# Patient Record
Sex: Female | Born: 1970 | Race: White | Hispanic: No | Marital: Married
Health system: Midwestern US, Academic
[De-identification: ages and names within clinical notes are randomized; demographics above are authoritative.]

---

## 2020-01-20 ENCOUNTER — Ambulatory Visit: Admit: 2020-01-20 | Discharge: 2020-01-21 | Payer: Private Health Insurance - Indemnity

## 2020-01-20 ENCOUNTER — Encounter: Admit: 2020-01-20 | Discharge: 2020-01-20 | Payer: Private Health Insurance - Indemnity

## 2020-01-20 DIAGNOSIS — D229 Melanocytic nevi, unspecified: Secondary | ICD-10-CM

## 2020-01-20 DIAGNOSIS — L700 Acne vulgaris: Secondary | ICD-10-CM

## 2020-01-20 DIAGNOSIS — L814 Other melanin hyperpigmentation: Secondary | ICD-10-CM

## 2020-01-20 MED ORDER — SPIRONOLACTONE 25 MG PO TAB
75 mg | ORAL_TABLET | Freq: Every day | ORAL | 3 refills | 90.00000 days | Status: AC
Start: 2020-01-20 — End: ?

## 2020-01-20 MED ORDER — TRETINOIN 0.1 % TP CREA
Freq: Every evening | TOPICAL | 3 refills | Status: AC
Start: 2020-01-20 — End: ?

## 2020-01-20 NOTE — Progress Notes
ATTESTATION    I personally performed the key portions of the E/M visit, discussed case with resident and concur with resident documentation of history, physical exam, assessment, and treatment plan unless otherwise noted.     Staff name:  Amaia Lavallie A Mersades Barbaro, MD Date:  01/20/2020

## 2020-01-20 NOTE — Progress Notes
Date of Service: 01/20/2020    Subjective:             Shelley Aguilar is a 49 y.o. female.    History of Present Illness  NEW PATIENT referred by Maisie Fus  No personal or family h/o skin cancer. No h/o blistering sunburns.    # Acne  - Pt reports that she has had significant issues with cystic acne x 1 year  - pt develops deep, painful lesions on the chin/jawline  - She notes that development of cystic lesions is cyclical, as if they are developing around her menstrual cycle, although she has been amenorrheic for 18 months now  - She is developing scarring on the chin d/t these lesions  - Prior treatments include topical tretinoin, PO doxycycline. These did not help her acne       Review of Systems   Constitutional: Negative for appetite change, diaphoresis, fatigue, fever and unexpected weight change.   HENT: Negative for congestion, mouth sores and sore throat.    Eyes: Negative for pain, redness, itching and visual disturbance.   Respiratory: Negative for cough and shortness of breath.    Cardiovascular: Negative for palpitations and leg swelling.   Gastrointestinal: Negative for abdominal pain, blood in stool, diarrhea, nausea and vomiting.   Genitourinary: Negative for difficulty urinating and hematuria.   Musculoskeletal: Negative for arthralgias and myalgias.   Neurological: Negative for dizziness, seizures and facial asymmetry.   Hematological: Does not bruise/bleed easily.   Psychiatric/Behavioral: Negative for confusion and dysphoric mood. The patient is not nervous/anxious.          Objective:         No current outpatient medications on file.     Vitals:    01/20/20 1048   Weight: 77.1 kg (170 lb)   Height: 175.3 cm (69)   PainSc: Zero     Body mass index is 25.1 kg/m?Marland Kitchen     Physical Exam  Areas Examined (all normal unless noted below):  Head/Face  Neck  Chest/breasts/axillae  Back  Abdomen  Buttocks/groin  R upper ext  L upper ext  R lower ext  L lower ext    Pertinent findings include:  Multiple brown and tan evenly pigmented macules are distributed over the head, neck, trunk, arms and legs.  All have symmetric similar dermascopic findings with primarily globular and reticular patterns.    Reticulated hyperpigmented macules distributed in sun-exposed areas of face, arms    Erythematous papules and pustules on the cheeks, jawline  Few pitted scars on the chin       Assessment and Plan:  # Acne- hormonal  - Discussed diagnosis, etiology  - Counseled on treatment options including topicals, systemic therapies. Pt is not interested in repeating treatment w/ topicals or PO Abx d/t lack of improvement in the past  - Start OTC BPO wash QAM  - Resume tretinoin 0.1% cream QHS as tolerated  - Start Rx spironolactone 75 mg PO daily. Advised that pt may start with 25-50 mg initially, then increase as tolerated.  - Disused benefits/risks and common side effects including lightheadedness, dizziness, increased urination, breast tenderness, menstrual irregularities, and headaches. If experiencing severe muscle cramps, instructed to stop medication and see medical attention  - Pt denies any kidney or heart problems, not on any BP meds  - Discussed that pt needs to hold spironolactone if taking any sulfa drug  - Previously discussed black box warning of increased risk of breast cancer although there is now  data showing no increased risks of breast cancer in pts with family hx of breast cancer    # Melanocytic nevi  - Will cont to monitor  - RTC for new/changing lesions  - counseled on ABCD's of melanoma   - counseled on sunscreen SPF 30 or greater and wide-brimmed hat use  - recommend vitamin D3 1000-2000 U/day in winter    # Solar lentigo  - counseled on benign nature  - recommended sun avoidance/sun protection  - Monitor for changes      RTC 4 months

## 2020-01-20 NOTE — Patient Instructions
Differin brand 5% benzoyl peroxide wash

## 2020-02-23 ENCOUNTER — Encounter: Admit: 2020-02-23 | Discharge: 2020-02-23 | Payer: Private Health Insurance - Indemnity

## 2020-03-29 ENCOUNTER — Encounter: Admit: 2020-03-29 | Discharge: 2020-03-29 | Payer: Private Health Insurance - Indemnity

## 2020-05-13 ENCOUNTER — Encounter: Admit: 2020-05-13 | Discharge: 2020-05-13 | Payer: Private Health Insurance - Indemnity

## 2020-06-09 ENCOUNTER — Encounter: Admit: 2020-06-09 | Discharge: 2020-06-09 | Payer: Private Health Insurance - Indemnity

## 2020-06-13 ENCOUNTER — Encounter: Admit: 2020-06-13 | Discharge: 2020-06-13 | Payer: Private Health Insurance - Indemnity

## 2020-06-13 MED ORDER — SULFAMETHOXAZOLE-TRIMETHOPRIM 800-160 MG PO TAB
1 | ORAL_TABLET | Freq: Two times a day (BID) | ORAL | 0 refills | Status: AC
Start: 2020-06-13 — End: ?

## 2020-06-14 ENCOUNTER — Encounter: Admit: 2020-06-14 | Discharge: 2020-06-14 | Payer: Private Health Insurance - Indemnity

## 2020-06-14 DIAGNOSIS — L700 Acne vulgaris: Secondary | ICD-10-CM

## 2020-06-14 MED ORDER — DOXYCYCLINE HYCLATE 100 MG PO CAP
100 mg | ORAL_CAPSULE | Freq: Every day | ORAL | 4 refills | 8.00000 days | Status: AC
Start: 2020-06-14 — End: ?

## 2020-06-14 MED ORDER — SPIRONOLACTONE 50 MG PO TAB
150 mg | ORAL_TABLET | Freq: Every day | ORAL | 3 refills | 46.00000 days | Status: AC
Start: 2020-06-14 — End: ?

## 2020-09-19 ENCOUNTER — Encounter: Admit: 2020-09-19 | Discharge: 2020-09-19 | Payer: Private Health Insurance - Indemnity

## 2020-09-19 DIAGNOSIS — L700 Acne vulgaris: Secondary | ICD-10-CM

## 2020-09-19 MED ORDER — SPIRONOLACTONE 50 MG PO TAB
ORAL_TABLET | Freq: Every day | ORAL | 0 refills | 46.00000 days | Status: AC
Start: 2020-09-19 — End: ?

## 2020-09-19 NOTE — Telephone Encounter
Received E-Rx request from patients pharmacy for spironolactone, this is not a standing order, per protocol, routing to physician for approval.    LOV: 01/20/2020   NOV:   Future Appointments   Date Time Provider [place]   10/05/2020 10:00 AM Rowe Pavy, MD [facility]           Francisca December, RN

## 2020-09-28 ENCOUNTER — Encounter: Admit: 2020-09-28 | Discharge: 2020-09-28 | Payer: Private Health Insurance - Indemnity

## 2020-09-28 DIAGNOSIS — L700 Acne vulgaris: Secondary | ICD-10-CM

## 2020-10-05 ENCOUNTER — Ambulatory Visit: Admit: 2020-10-05 | Discharge: 2020-10-05 | Payer: Private Health Insurance - Indemnity

## 2020-10-05 ENCOUNTER — Encounter: Admit: 2020-10-05 | Discharge: 2020-10-05 | Payer: Private Health Insurance - Indemnity

## 2020-10-05 DIAGNOSIS — L700 Acne vulgaris: Secondary | ICD-10-CM

## 2020-10-05 DIAGNOSIS — D229 Melanocytic nevi, unspecified: Secondary | ICD-10-CM

## 2020-10-05 DIAGNOSIS — Z5181 Encounter for therapeutic drug level monitoring: Secondary | ICD-10-CM

## 2020-10-05 LAB — ESTRADIOL (E2): ESTRADIOL: <15 pg/mL

## 2020-10-05 LAB — FOLLICLE STIMULATING HORMONE: FSH: 106 mU/mL

## 2020-10-05 LAB — PROGESTERONE: PROGESTERONE: 0.8 ng/mL

## 2020-10-05 NOTE — Progress Notes
ATTESTATION    Tried to prescribe isotretinoin for her recalcitrant acne. She has gone through menopause but does not have the appropriate labs necessary for ipledge to dedicate her as not having child bearing potential. Will get necessary labs and see her back in 1 month for isotretinoin start    I personally performed the key portions of the E/M visit, discussed case with resident and concur with resident documentation of history, physical exam, assessment, and treatment plan unless otherwise noted.    Staff name:  Georgiann Hahn Date:  10/05/2020

## 2020-10-05 NOTE — Progress Notes
Date of Service: 10/05/2020    Subjective:             Shelley Aguilar is a 50 y.o. female.    History of Present Illness  [facility]  01/20/20 w/ Dr. Arlana Pouch     No personal or family h/o skin cancer. No h/o blistering sunburns.  # Acne  ?Prior Hx   - Pt reports that she has had significant issues with cystic acne x 1 year  - pt develops deep, painful lesions on the chin/jawline  - She notes that development of cystic lesions is cyclical, as if they are developing around her menstrual cycle, although she has been amenorrheic for 18 months now  - She is developing scarring on the chin d/t these lesions  - Prior treatments include topical tretinoin, PO doxycycline. These did not help her acne  Prior Tx   - Bactrim  Current Tx  - Doxycycline 100mg  daily   - Tretinoin topical cream 0.1% 2-3x weekly   - Spironolactone 150mg  daily   Interval Hx  - Patient states that she has improved since her last appt ~10 months ago, but is still miserable  - She states that her face still flares monthly, and when she does she gets painful swollen lymph nodes under her jaw  - She would like additional therapies today as she feels like nothing else helps consistently      Review of Systems   Constitutional: Negative for appetite change and unexpected weight change.   Gastrointestinal: Negative for diarrhea, nausea and vomiting.         Objective:         ? doxycycline hyclate (VIBRAMYCIN) 100 mg capsule Take one capsule by mouth daily.   ? spironolactone (ALDACTONE) 50 mg tablet TAKE 3 TABLETS BY MOUTH ONCE DAILY WITH FOOD   ? tretinoin (RETIN-A) 0.1 % topical cream Apply  topically to affected area at bedtime daily.     There were no vitals filed for this visit.  There is no height or weight on file to calculate BMI.     Physical Exam  Areas Examined (all normal unless noted below):  Head/Face  Neck  Chest/breasts/axillae  Back  Abdomen  Buttocks/groin  R upper ext  L upper ext  R lower ext  L lower ext  ?  Pertinent findings include:  Multiple brown and tan evenly pigmented macules are distributed over the head, neck,?trunk, arms and legs.? All have symmetric similar dermascopic findings with primarily globular and reticular patterns.  ?  Reticulated hyperpigmented macules distributed in sun-exposed areas of face, arms  ?  Erythematous papules and pustules on the cheeks, jawline  Few pitted scars on the chin       Assessment and Plan:  # Acne- hormonal, flaring  - Discussed diagnosis, etiology  - Counseled on treatment options including topicals, systemic therapies.  - Plan to start isotretinoin pending lab results regarding potential child bearing status  - Ordered estradiol, progesterone, FSH labs today   - Once isotretinoin initiated, counseled patient that we will discontinue other Rx treatments below   - Continue OTC BPO wash QAM  - Continue tretinoin 0.1% cream QHS as tolerated  - Continue Rx spironolactone 150 mg PO daily  - Continue Rx Doxycycline 100mg  PO daily   - Disused benefits/risks and common side effects including lightheadedness, dizziness, increased urination, breast tenderness, menstrual irregularities, and headaches. If experiencing severe muscle cramps, instructed to stop medication and see medical attention  - Pt denies  any kidney or heart problems, not on any BP meds  - Discussed that pt needs to hold spironolactone if taking any sulfa drug  - Previously discussed black box warning of increased risk of breast cancer although there is now data showing no increased risks of breast cancer in pts with family hx of breast cancer  ?  # Melanocytic nevi  - Will cont to monitor  - RTC for new/changing lesions  - counseled on ABCD's of melanoma   - counseled on sunscreen SPF 30 or greater and wide-brimmed hat use  - recommend vitamin D3 1000-2000 U/day in winter  ?  # Solar lentigo  - counseled on benign nature  - recommended sun avoidance/sun protection  - Monitor for changes  ?  ?  RTC 3 weeks, will start accutane pending lab results regarding child bearing status

## 2020-10-06 ENCOUNTER — Encounter: Admit: 2020-10-06 | Discharge: 2020-10-06 | Payer: Private Health Insurance - Indemnity

## 2020-10-26 ENCOUNTER — Encounter: Admit: 2020-10-26 | Discharge: 2020-10-26 | Payer: Private Health Insurance - Indemnity

## 2020-10-26 ENCOUNTER — Ambulatory Visit: Admit: 2020-10-26 | Discharge: 2020-10-26 | Payer: Private Health Insurance - Indemnity

## 2020-10-26 DIAGNOSIS — Z7689 Persons encountering health services in other specified circumstances: Secondary | ICD-10-CM

## 2020-10-26 DIAGNOSIS — L700 Acne vulgaris: Secondary | ICD-10-CM

## 2020-10-26 DIAGNOSIS — L730 Acne keloid: Secondary | ICD-10-CM

## 2020-10-26 MED ORDER — ISOTRETINOIN 30 MG PO CAP
30 mg | ORAL_CAPSULE | Freq: Two times a day (BID) | ORAL | 0 refills | Status: DC
Start: 2020-10-26 — End: 2020-10-26

## 2020-10-26 MED ORDER — ISOTRETINOIN 30 MG PO CAP
30 mg | ORAL_CAPSULE | Freq: Every day | ORAL | 0 refills | Status: AC
Start: 2020-10-26 — End: ?

## 2020-10-26 NOTE — Progress Notes
ATTESTATION    I personally performed the key portions of the E/M visit, discussed case with resident and concur with resident documentation of history, physical exam, assessment, and treatment plan unless otherwise noted.    Staff name:  Loriel Diehl Date:  10/26/2020

## 2020-10-26 NOTE — Progress Notes
Date of Service: 10/26/2020    Subjective:             Shelley Aguilar is a 50 y.o. female.    History of Present Illness    No personal or family h/o skin cancer. No h/o blistering sunburns.  #?Acne  ?Prior Hx   - Pt reports that she has had significant issues with cystic acne x 1 year  - pt develops deep, painful lesions on the chin/jawline  - She notes that development of cystic lesions is cyclical, as if they are developing around her menstrual cycle, although she has been amenorrheic for 18 months now  - She is developing scarring on the chin d/t these lesions  - Prior treatments include topical tretinoin, PO doxycycline. These did not help her acne  Prior Tx   - Bactrim  Current Tx  - Doxycycline 100mg  daily   - Tretinoin topical cream 0.1% 2-3x weekly   - Spironolactone 150mg  daily   Interval Hx  - Plan to start Accutane this visit as her labs are consistent with post-menopausal labs.   - using the doxycycline and spironolactone at the moment, but is still flaring        Review of Systems   Constitutional: Negative for appetite change and unexpected weight change.   Gastrointestinal: Negative for diarrhea, nausea and vomiting.         Objective:         ? doxycycline hyclate (VIBRAMYCIN) 100 mg capsule Take one capsule by mouth daily.   ? spironolactone (ALDACTONE) 50 mg tablet TAKE 3 TABLETS BY MOUTH ONCE DAILY WITH FOOD     Vitals:    10/26/20 1254   Resp: 16   Weight: 77.1 kg (170 lb)   Height: 172.7 cm (5' 8)     Body mass index is 25.85 kg/m?Marland Kitchen     Physical Exam    Areas Examined (all normal unless noted below):  Head/Face  Neck  Chest/breasts/axillae    Pertinent findings include:  General: Alert and Oriented x 3, Well-nourished  Eyes: Normal Conjunctivae, EOMI  Psych: normal mood    Multiple brown and tan evenly pigmented macules are distributed over the head, neck, trunk, arms and legs.  All have symmetric similar dermascopic findings with primarily globular and reticular patterns.       Multiple erythematous, inflammatory smooth 1-4 mm papules and open comedones are noted on the face.           Assessment and Plan:  # Acne- hormonal, flaring  1. Severe inflammatory Acne with scarring  - Discussed treatment options including topicals, oral antibiotics, and accutane  - Discussed A/R/B's of accutane in detail  - Patient recently had Lipids and LFTs which she will fax over   - Encouraged daily sunprotection and non-comedogenic moisturizer   - 2 forms of birth control: Patient is Post-menopausal   - Start (Rx) Isotretinoin 30 mg once daily   - Registered for iPledge today. iPledge number:  [id number]    Accutane is discussed fully with the patient. It is a very effective drug to treat acne vulgaris but has many significant side effects. Chief among these are teratogenesis, hepatic injury, dyslipidemia and severe drying of the mucous membranes, dry eyes, dry mouth, depression, unmasking crohn's/UC, SI, myalgias, bone pain, birth defects if she were to become pregnant, LFT elevation, WBC abnormalities, hypertriglyceridemia. All of these issues have been discussed in detail. Monthly blood tests to monitor lipids and liver functions will be necessary. Expect  painful dryness and/or fissuring around the lips, eyes, and other moist areas of the body. Balms may be protective. Temporarily, contact lens may be too painful to wear while on this drug. Episodes of significant depression have been reported, including suicidal ideation and attempts in rare cases. It may also cause pseudotumor cerebri and hyperostosis. The patient will report any such changes in mood, depressive symptoms or suicidal thoughts, headaches, joint or bone pains.    Female patients MUST use two simultaneous methods of family planning. Accutane is Category X for pregnancy, meaning it will cause fetal teratogenic malformations, and pregnancy MUST be avoided while on this drug.    The dose is 0.5-1 mg/kg in two divided doses for 15-20 weeks.      After discussion of these important issues, she and her mother indicate complete understanding of all of the above, and do wish to proceed with Accutane therapy.    - Patient was reminded to discontinue medication and call immediately for signs of depression or suicidal thought, use daily photo protection and oil-free moisturizer, avoid unprotected sex, alcohol ingestion, blood donation, elective procedures (tattoos, piercings, etc.) and was warned about possibility of arrested growth. Patient was reminded to continue two forms of birth control without interruption (or choose abstinence) and to fill prescription within 7 days of visit or iPledge update.    - Discussed diagnosis, etiology  - Discontinue tretinoin 0.1% cream QHS as tolerated  - Discontinue Rx spironolactone?150?mg PO daily  - Discontinue Rx Doxycycline 100mg  PO daily      RTC in 4 weeks

## 2020-10-26 NOTE — Progress Notes
PA submitted to patient's insurance for coverage of isotretinoin 30 mg capsules, take one capsule daily. Per insurance, determination to be faxed back once review is completed.

## 2020-10-28 ENCOUNTER — Encounter: Admit: 2020-10-28 | Discharge: 2020-10-28 | Payer: Private Health Insurance - Indemnity

## 2020-10-28 DIAGNOSIS — Z5181 Encounter for therapeutic drug level monitoring: Secondary | ICD-10-CM

## 2020-10-28 LAB — LIVER FUNCTION PANEL
ALBUMIN: 3.8 (ref 3.5–5)
ALK PHOSPHATASE: 66 (ref 40–150)
ALT: 15 (ref 0–55)
AST: 14 (ref 5–34)
DIRECT BILIRUBIN: 0.1 (ref 0–0.5)
TOTAL BILIRUBIN: 0.2 (ref 0.2–1.2)
TOTAL PROTEIN: 6.6 (ref 6.4–8.3)

## 2020-10-28 NOTE — Progress Notes
Lab results for LFT received from [facility] in [place]. Results entered into patient chart and copy of lab sent to be scanned into patient chart.

## 2020-11-29 ENCOUNTER — Encounter: Admit: 2020-11-29 | Discharge: 2020-11-29 | Payer: Private Health Insurance - Indemnity

## 2020-11-29 ENCOUNTER — Ambulatory Visit: Admit: 2020-11-29 | Discharge: 2020-11-30 | Payer: Private Health Insurance - Indemnity

## 2020-11-29 DIAGNOSIS — L700 Acne vulgaris: Secondary | ICD-10-CM

## 2020-11-29 MED ORDER — ISOTRETINOIN 40 MG PO CAP
40 mg | ORAL_CAPSULE | Freq: Two times a day (BID) | ORAL | 0 refills | Status: AC
Start: 2020-11-29 — End: ?

## 2020-11-29 NOTE — Patient Instructions
The nature of sun-induced photo-aging and skin cancers is discussed.  Sun avoidance, protective clothing, and the use of 30-SPF sunscreens are advised. Observe closely for skin damage/changes, and call if such occur.

## 2020-11-29 NOTE — Progress Notes
Date of Service: 11/29/2020    Subjective:             Shelley Aguilar is a 50 y.o. female.    History of Present Illness    Return pt, LOV 10/27/19    No personal or family h/o skin cancer. No h/o blistering sunburns.  #?Acne  ?Prior Hx   - Pt reports that she has had significant issues with cystic acne x 1 year  - pt develops deep, painful lesions on the chin/jawline  - She notes that development of cystic lesions is cyclical, as if they are developing around her menstrual cycle, although she has been amenorrheic for 18 months now  - She is developing scarring on the chin d/t these lesions  - Prior treatments include topical tretinoin, PO doxycycline. These did not help her acne  Prior Tx   - Bactrim  - Doxycycline 100mg  daily   - Tretinoin topical cream 0.1% 2-3x weekly   - Spironolactone 150mg  daily   Interval Hx  - taking isotretinoin x1 month  - doing well   - has not noticed improvement in 1 month  - tolerating isotretinoin well     Dosing:   Accutane months completed: 1 month  Cumulative dose: 900mg   Cumulative dose per kg: 5.08mg /kg (goal dose: 200-220mg /kg to decrease risk of relapse)      Constitutional: no fevers, chills  Skin: dry lips  MSK: no joint pains or muscle aches  GI: no abdominal pain, nausea, or vomiting  Psych: no mood changes  HEENT: no headaches      Objective:         No current outpatient medications on file.     Vitals:    11/29/20 0902   PainSc: Zero   Weight: 77.1 kg (170 lb)   Height: 172.7 cm (5' 8)     Body mass index is 25.85 kg/m?Marland Kitchen     Physical Exam    Areas Examined (all normal unless noted below):  Head/Face  Neck  Chest/breasts/axillae    Pertinent findings include:  General: Alert and Oriented x 3, Well-nourished  Eyes: Normal Conjunctivae, EOMI  Psych: normal mood      Multiple erythematous, inflammatory smooth 1-4 mm papules and open comedones are noted on the face.           Assessment and Plan:  # Acne- hormonal, flaring  1. Severe inflammatory Acne with scarring  - Discussed treatment options including topicals, oral antibiotics, and accutane  - Discussed A/R/B's of accutane in detail  - Patient recently had Lipids and LFTs WNL    - Encouraged daily sunprotection and non-comedogenic moisturizer   - 2 forms of birth control: Patient is Post-menopausal   - Continue (Rx) Isotretinoin but increase to 40 mg once bid    - Registered for iPledge today. iPledge number:  [id number]    Isotretinoin information discussed with patient:  Accutane is discussed fully with the patient. It is a very effective drug to treat acne vulgaris but has many significant side effects. Chief among these are teratogenesis, hepatic injury, dyslipidemia and severe drying of the mucous membranes, dry eyes, dry mouth, depression, unmasking crohn's/UC, SI, myalgias, bone pain, birth defects if she were to become pregnant, LFT elevation, WBC abnormalities, hypertriglyceridemia. All of these issues have been discussed in detail. Monthly blood tests to monitor lipids and liver functions will be necessary. Expect painful dryness and/or fissuring around the lips, eyes, and other moist areas of the body. Balms may be  protective. Temporarily, contact lens may be too painful to wear while on this drug. Episodes of significant depression have been reported, including suicidal ideation and attempts in rare cases. It may also cause pseudotumor cerebri and hyperostosis. The patient will report any such changes in mood, depressive symptoms or suicidal thoughts, headaches, joint or bone pains.    Female patients MUST use two simultaneous methods of family planning. Accutane is Category X for pregnancy, meaning it will cause fetal teratogenic malformations, and pregnancy MUST be avoided while on this drug. Patient is unable to have children     Patient was reminded to discontinue medication and call immediately for signs of depression or suicidal thought, use daily photo protection and oil-free moisturizer, avoid unprotected sex, alcohol ingestion, blood donation, elective procedures (tattoos, piercings, etc.) and was warned about possibility of arrested growth. Patient was reminded to fill prescription within 7 days of visit or iPledge update.       RTC in 4 weeks

## 2020-12-29 ENCOUNTER — Ambulatory Visit: Admit: 2020-12-29 | Discharge: 2020-12-30 | Payer: Private Health Insurance - Indemnity

## 2020-12-29 ENCOUNTER — Encounter: Admit: 2020-12-29 | Discharge: 2020-12-29 | Payer: Private Health Insurance - Indemnity

## 2020-12-29 DIAGNOSIS — L700 Acne vulgaris: Secondary | ICD-10-CM

## 2020-12-29 MED ORDER — ISOTRETINOIN 40 MG PO CAP
40 mg | ORAL_CAPSULE | Freq: Two times a day (BID) | ORAL | 0 refills | Status: AC
Start: 2020-12-29 — End: ?

## 2020-12-29 NOTE — Progress Notes
Date of Service: 12/29/2020    Subjective:             Shelley Aguilar is a 50 y.o. female.    History of Present Illness    Return pt, LOV 11/29/20    No personal or family h/o skin cancer. No h/o blistering sunburns.  #?Acne  ?Prior Hx   - Pt reports that she has had significant issues with cystic acne x 1 year  - pt develops deep, painful lesions on the chin/jawline  - She notes that development of cystic lesions is cyclical, as if they are developing around her menstrual cycle, although she has been amenorrheic for 18 months now  - She is developing scarring on the chin d/t these lesions  - Prior treatments include topical tretinoin, PO doxycycline. These did not help her acne  Prior Tx   - Bactrim  - Doxycycline 100mg  daily   - Tretinoin topical cream 0.1% 2-3x weekly   - Spironolactone 150mg  daily   Interval Hx  - taking isotretinoin x2 month  - doing well   - flared in the past month  - tolerating isotretinoin well     Dosing:   Accutane months completed: 1 month  Cumulative dose: 900mg  + 2400 mg  Cumulative dose per kg: 40.9 mg/kg (goal dose: 200-220mg /kg to decrease risk of relapse) updated 12/8      Constitutional: no fevers, chills  Skin: dry lips  MSK: no joint pains or muscle aches  GI: no abdominal pain, nausea, or vomiting  Psych: no mood changes  HEENT: no headaches      Objective:         ? ISOtretinoin (ACCUTANE) 40 mg capsule Take one capsule by mouth twice daily for 30 days.     Vitals:    12/29/20 0840   PainSc: Zero   Weight: 80.7 kg (178 lb)   Height: 172.7 cm (5' 8)     Body mass index is 27.06 kg/m?Marland Kitchen     Physical Exam    Areas Examined (all normal unless noted below):  Head/Face  Neck  Chest/breasts/axillae    Pertinent findings include:  General: Alert and Oriented x 3, Well-nourished  Eyes: Normal Conjunctivae, EOMI  Psych: normal mood      Multiple erythematous, inflammatory smooth 1-4 mm papules and open comedones are noted on the face.           Assessment and Plan:  # Acne- hormonal, flaring  #Xerosis  1. Severe inflammatory Acne with scarring  - Discussed treatment options including topicals, oral antibiotics, and accutane  - Discussed A/R/B's of accutane in detail  - Patient recently had Lipids and LFTs WNL. Will repeat labs today   - Encouraged daily sunprotection and non-comedogenic moisturizer   - 2 forms of birth control: Patient is Post-menopausal   - Continue (Rx) Isotretinoin40 mg bid    - Registered for iPledge. iPledge number:  [id number]    Isotretinoin information discussed with patient:  Accutane is discussed fully with the patient. It is a very effective drug to treat acne vulgaris but has many significant side effects. Chief among these are teratogenesis, hepatic injury, dyslipidemia and severe drying of the mucous membranes, dry eyes, dry mouth, depression, unmasking crohn's/UC, SI, myalgias, bone pain, birth defects if she were to become pregnant, LFT elevation, WBC abnormalities, hypertriglyceridemia. All of these issues have been discussed in detail. Monthly blood tests to monitor lipids and liver functions will be necessary. Expect painful dryness and/or fissuring around  the lips, eyes, and other moist areas of the body. Balms may be protective. Temporarily, contact lens may be too painful to wear while on this drug. Episodes of significant depression have been reported, including suicidal ideation and attempts in rare cases. It may also cause pseudotumor cerebri and hyperostosis. The patient will report any such changes in mood, depressive symptoms or suicidal thoughts, headaches, joint or bone pains.    Female patients MUST use two simultaneous methods of family planning. Accutane is Category X for pregnancy, meaning it will cause fetal teratogenic malformations, and pregnancy MUST be avoided while on this drug. Patient is unable to have children     Patient was reminded to discontinue medication and call immediately for signs of depression or suicidal thought, use daily photo protection and oil-free moisturizer, avoid unprotected sex, alcohol ingestion, blood donation, elective procedures (tattoos, piercings, etc.) and was warned about possibility of arrested growth. Patient was reminded to fill prescription within 7 days of visit or iPledge update.       RTC in 4 weeks

## 2020-12-30 ENCOUNTER — Encounter: Admit: 2020-12-30 | Discharge: 2020-12-30 | Payer: Private Health Insurance - Indemnity

## 2020-12-30 DIAGNOSIS — Z79899 Other long term (current) drug therapy: Secondary | ICD-10-CM

## 2021-01-28 ENCOUNTER — Encounter: Admit: 2021-01-28 | Discharge: 2021-01-28 | Payer: Private Health Insurance - Indemnity

## 2021-01-30 ENCOUNTER — Encounter: Admit: 2021-01-30 | Discharge: 2021-01-30 | Payer: Private Health Insurance - Indemnity

## 2021-01-30 DIAGNOSIS — Z79899 Other long term (current) drug therapy: Secondary | ICD-10-CM

## 2021-01-30 LAB — LIPID PROFILE
CHOLESTEROL/HDL %: 5 (ref 0–5)
CHOLESTEROL: 286 — ABNORMAL HIGH (ref 0–200)
HDL: 63 (ref 40–150)
LDL: 202 — ABNORMAL HIGH (ref 5–100)
TRIGLYCERIDES: 105 (ref 3.5–150)
VLDL: 21 (ref 5–40)

## 2021-01-30 LAB — LIVER FUNCTION PANEL: TOTAL BILIRUBIN: 0.2 (ref 0.2–1.2)

## 2021-01-31 ENCOUNTER — Encounter: Admit: 2021-01-31 | Discharge: 2021-01-31 | Payer: Private Health Insurance - Indemnity

## 2021-01-31 ENCOUNTER — Ambulatory Visit: Admit: 2021-01-31 | Discharge: 2021-02-01 | Payer: Private Health Insurance - Indemnity

## 2021-01-31 DIAGNOSIS — L700 Acne vulgaris: Secondary | ICD-10-CM

## 2021-01-31 MED ORDER — ISOTRETINOIN 40 MG PO CAP
40 mg | ORAL_CAPSULE | Freq: Two times a day (BID) | ORAL | 0 refills | Status: DC
Start: 2021-01-31 — End: 2021-01-31

## 2021-01-31 MED ORDER — MUPIROCIN 2 % TP OINT
Freq: Two times a day (BID) | TOPICAL | 3 refills | 11.00000 days | Status: AC
Start: 2021-01-31 — End: ?

## 2021-01-31 MED ORDER — ISOTRETINOIN 40 MG PO CAP
40 mg | ORAL_CAPSULE | Freq: Two times a day (BID) | ORAL | 0 refills | Status: AC
Start: 2021-01-31 — End: ?

## 2021-01-31 NOTE — Progress Notes
Date of Service: 01/31/2021    Subjective:             Shelley Aguilar is a 51 y.o. female.    History of Present Illness    Return pt, LOV 12/29/20    No personal or family h/o skin cancer. No h/o blistering sunburns.  #?Acne  ?Prior Hx   - Pt reports that she has had significant issues with cystic acne x 1 year  - pt develops deep, painful lesions on the chin/jawline  - She notes that development of cystic lesions is cyclical, as if they are developing around her menstrual cycle, although she has been amenorrheic for 18 months now  - She is developing scarring on the chin d/t these lesions  - Prior treatments include topical tretinoin, PO doxycycline. These did not help her acne  Prior Tx   - Bactrim  - Doxycycline 100mg  daily   - Tretinoin topical cream 0.1% 2-3x weekly   - Spironolactone 150mg  daily   Interval Hx  - taking isotretinoin x3 month  - noticed it has been improving  - notes she had a large bump on L chin that she picked and a large hair came out, healing well   - had lipids drawn and is aware of her elevated total cholesterol. Has spoken to PCP about it and has had further testing and they decided to not treat as she has no plaque build up on her arteries (states they did cardiac CT)    Dosing:   Accutane months completed: 3 month  Weight: 77.1 kg  Cumulative dose: 900mg  + 2400 + 2400 mg= 5700  Cumulative dose per kg: 73.9 mg/kg, updated 01/31/21 (goal dose: 200-220mg /kg to decrease risk of relapse)      Constitutional: no fevers, chills  Skin: dry lips, improved   MSK: no joint pains or muscle aches  GI: no abdominal pain, nausea, or vomiting  Psych: no mood changes  HEENT: no headaches, some crusted blood in nose      Objective:         No current outpatient medications on file.     There were no vitals filed for this visit.  There is no height or weight on file to calculate BMI.     Physical Exam    Areas Examined (all normal unless noted below):  Head/Face  Neck  Chest/breasts/axillae    Pertinent findings include:  General: Alert and Oriented x 3, Well-nourished  Eyes: Normal Conjunctivae, EOMI  Psych: normal mood      Multiple erythematous, inflammatory smooth 1-4 mm papules and open comedones are noted on the face.  L chin is a shallow ulceration with impetiginized crusting          Assessment and Plan:  # Acne- hormonal,   #Xerosis, improving  1. Severe inflammatory Acne with scarring  - Discussed treatment options including topicals, oral antibiotics, and accutane  - Discussed A/R/B's of accutane in detail  - labs reviewed: lipids and lfts reviewed from 01/27/21: is aware of her elevated total cholesterol. Has spoken to PCP about it and has had further testing and they decided to not treat as she has no plaque build up on her arteries (states they did cardiac CT).    - Encouraged daily sunprotection and non-comedogenic moisturizer   - 2 forms of birth control: Patient is Post-menopausal   - Continue (Rx) Isotretinoin 40 mg bid    - Registered for iPledge. iPledge number:  [id number]  Isotretinoin information discussed with patient:  Accutane is discussed fully with the patient. It is a very effective drug to treat acne vulgaris but has many significant side effects. Chief among these are teratogenesis, hepatic injury, dyslipidemia and severe drying of the mucous membranes, dry eyes, dry mouth, depression, unmasking crohn's/UC, SI, myalgias, bone pain, birth defects if she were to become pregnant, LFT elevation, WBC abnormalities, hypertriglyceridemia. All of these issues have been discussed in detail. Monthly blood tests to monitor lipids and liver functions will be necessary. Expect painful dryness and/or fissuring around the lips, eyes, and other moist areas of the body. Balms may be protective. Temporarily, contact lens may be too painful to wear while on this drug. Episodes of significant depression have been reported, including suicidal ideation and attempts in rare cases. It may also cause pseudotumor cerebri and hyperostosis. The patient will report any such changes in mood, depressive symptoms or suicidal thoughts, headaches, joint or bone pains.    Female patients MUST use two simultaneous methods of family planning. Accutane is Category X for pregnancy, meaning it will cause fetal teratogenic malformations, and pregnancy MUST be avoided while on this drug. Patient is unable to have children     Patient was reminded to discontinue medication and call immediately for signs of depression or suicidal thought, use daily photo protection and oil-free moisturizer, avoid unprotected sex, alcohol ingestion, blood donation, elective procedures (tattoos, piercings, etc.) and was warned about possibility of arrested growth. Patient was reminded to fill prescription within 7 days of visit or iPledge update.    #Open sore with impetiginized crusting, new issue  - had large bump and picked it and a large hair came out  - now with impetiginization   - start mupirocin tid x7 days        RTC in 4 weeks

## 2021-02-14 ENCOUNTER — Encounter: Admit: 2021-02-14 | Discharge: 2021-02-14 | Payer: Private Health Insurance - Indemnity

## 2021-02-14 MED ORDER — ISOTRETINOIN 40 MG PO CAP
40 mg | ORAL_CAPSULE | Freq: Two times a day (BID) | ORAL | 0 refills | Status: AC
Start: 2021-02-14 — End: ?

## 2021-02-14 NOTE — Progress Notes
Initial Rx sent after 1/10 appt was written for 15 day supply in error. Patient was able to fill this. An additional script was sent noting the 15 day supply and requesting this be filled as well to give patient her full 30 day supply. Patient was reconfirmed in iPledge at this time.    Patient reached out on 1/23 to state she had not gotten the second 15 day prescription because per the pharmacy she needed to be confirmed and the prescription was 'expired'. Spoke with pharmacy who stated that the prescription was expired and would not run through and that a new Rx was needed for the 2nd 15 day supply. Advised would confirm again and resubmit Rx for 15 day supply. If unsuccessful at this point, may need to wait until next appointment for further medication.

## 2021-02-28 ENCOUNTER — Ambulatory Visit: Admit: 2021-02-28 | Discharge: 2021-03-01 | Payer: Private Health Insurance - Indemnity

## 2021-02-28 ENCOUNTER — Encounter: Admit: 2021-02-28 | Discharge: 2021-02-28 | Payer: Private Health Insurance - Indemnity

## 2021-02-28 DIAGNOSIS — L700 Acne vulgaris: Secondary | ICD-10-CM

## 2021-02-28 MED ORDER — ISOTRETINOIN 40 MG PO CAP
40 mg | ORAL_CAPSULE | Freq: Two times a day (BID) | ORAL | 0 refills | Status: AC
Start: 2021-02-28 — End: ?

## 2021-02-28 NOTE — Progress Notes
Telehealth Visit Note    Date of Service: 02/28/2021    Subjective:        Shelley Aguilar is a 51 y.o. female here for isotretinoin follow up.     History of Present Illness  Return pt, LOV 12/29/20  ?  No personal or family h/o skin cancer. No h/o blistering sunburns.  #?Acne  ?Prior Hx?  - Pt reports that she has had significant issues with cystic acne x 1 year  - pt develops deep, painful lesions on the chin/jawline  - She notes that development of cystic lesions is cyclical, as if they are developing around her menstrual cycle, although she has been amenorrheic for 18 months now  - She is developing scarring on the chin d/t these lesions  - Prior treatments include topical tretinoin, PO doxycycline. These did not help her acne  Prior Tx?  -?Bactrim  -?Doxycycline 100mg  daily   - Tretinoin topical cream 0.1% 2-3x weekly   - Spironolactone 150mg  daily?  Interval Hx  - taking isotretinoin x4 month  - overall it has been improving. She has seen significant improvement in the past month   - had lipids drawn and is aware of her elevated total cholesterol. Has spoken to PCP about it and has had further testing and they decided to not treat as she has no plaque build up on her arteries (states they did cardiac CT)  - overall happy with therapy   ?  Dosing:   Accutane months completed: 4 month  Weight: 82.6 kg  Cumulative dose: 8100mg   Cumulative dose per kg: 98.06 mg/kg, updated 02/28/21 (goal dose: 200-220mg /kg to decrease risk of relapse)  ?  Constitutional: no fevers, chills  Skin: dry lips  MSK: no joint pains or muscle aches. Does note she is stiff in the AM but not overall bothersome to her  GI: no abdominal pain, nausea, or vomiting  Psych: no mood changes  HEENT: no headaches    .  Objective:         ? ISOtretinoin (ACCUTANE) 40 mg capsule Take one capsule by mouth twice daily.   ? mupirocin (CENTANY) 2 % topical ointment Apply  topically to affected area twice daily.          Telehealth Patient Reported Vitals Row Name 02/28/21 0859                Pain Score Zero                  Computed Telehealth Body Mass Index unavailable. Necessary lab results were not found in the last year.    Physical Exam    ?  Areas Examined (all normal unless noted below):  Head/Face  Neck  Chest/breasts/axillae  ?  Pertinent findings include:  General: Alert and Oriented x 3, Well-nourished  Eyes: Normal Conjunctivae, EOMI  Psych: normal mood  ?  A few scattered erythematous papules on cheeks, chin, face   ?     Assessment and Plan:  # Acne- hormonal,   # Xerosis secondary to isotretinoin, improving  # Severe inflammatory Acne with scarring  - Discussed treatment options including topicals, oral antibiotics, and accutane  - Discussed A/R/B's of accutane in detail  - labs reviewed: lipids and lfts reviewed from 01/27/21: is aware of her elevated total cholesterol. Has spoken to PCP about it and has had further testing and they decided to not treat as she has no plaque build up on her arteries (states they did  cardiac CT).    - Encouraged daily sunprotection and non-comedogenic moisturizer   - 2 forms of birth control: Patient is Post-menopausal   - Continue (Rx) Isotretinoin 40 mg bid    - Registered for iPledge. iPledge number:  [id number]  ?  Isotretinoin information discussed with patient:  Accutane is discussed fully with the patient. It is a very effective drug to treat acne vulgaris but has many significant side effects. Chief among these are teratogenesis, hepatic injury, dyslipidemia and severe drying of the mucous membranes, dry eyes, dry mouth, depression, unmasking crohn's/UC, SI, myalgias, bone pain, birth defects if she were to become pregnant, LFT elevation, WBC abnormalities, hypertriglyceridemia. All of these issues have been discussed in detail. Monthly blood tests to monitor lipids and liver functions will be necessary. Expect painful dryness and/or fissuring around the lips, eyes, and other moist areas of the body. Balms may be protective. Temporarily, contact lens may be too painful to wear while on this drug. Episodes of significant depression have been reported, including suicidal ideation and attempts in rare cases. It may also cause pseudotumor cerebri and hyperostosis. The patient will report any such changes in mood, depressive symptoms or suicidal thoughts, headaches, joint or bone pains.  ?  Female patients MUST use two simultaneous methods of family planning. Accutane is Category X for pregnancy, meaning it will cause fetal teratogenic malformations, and pregnancy MUST be avoided while on this drug. Patient is unable to have children  ?   Patient was reminded to discontinue medication and call immediately for signs of depression or suicidal thought, use daily photo protection and oil-free moisturizer, avoid unprotected sex, alcohol ingestion, blood donation, elective procedures (tattoos, piercings, etc.) and was warned about possibility of arrested growth. Patient was reminded to fill prescription within 7 days of visit or iPledge update.  ?  #Open sore with impetiginized crusting, resolved  - continue to monitor   ?  ?  RTC in 4 weeks       ?                       15 minutes spent on this patient's encounter with counseling and coordination of care taking >50% of the visit.

## 2021-02-28 NOTE — Patient Instructions
The nature of sun-induced photo-aging and skin cancers is discussed.  Sun avoidance, protective clothing, and the use of 30-SPF sunscreens are advised. Observe closely for skin damage/changes, and call if such occur.

## 2021-03-27 ENCOUNTER — Encounter: Admit: 2021-03-27 | Discharge: 2021-03-27 | Payer: Private Health Insurance - Indemnity

## 2021-03-27 ENCOUNTER — Ambulatory Visit: Admit: 2021-03-27 | Discharge: 2021-03-28 | Payer: Private Health Insurance - Indemnity

## 2021-03-27 DIAGNOSIS — L700 Acne vulgaris: Secondary | ICD-10-CM

## 2021-03-27 DIAGNOSIS — L730 Acne keloid: Secondary | ICD-10-CM

## 2021-03-27 MED ORDER — ISOTRETINOIN 40 MG PO CAP
40 mg | ORAL_CAPSULE | Freq: Two times a day (BID) | ORAL | 0 refills | Status: AC
Start: 2021-03-27 — End: ?

## 2021-03-27 NOTE — Patient Instructions
The nature of sun-induced photo-aging and skin cancers is discussed.  Sun avoidance, protective clothing, and the use of 30-SPF sunscreens are advised. Observe closely for skin damage/changes, and call if such occur.

## 2021-03-27 NOTE — Progress Notes
Telehealth Visit Note    Date of Service: 03/27/2021    Subjective:        Shelley Aguilar is a 51 y.o. female here for isotretinoin follow up.     History of Present Illness  Return pt, LOV 02/2021  ?  No personal or family h/o skin cancer. No h/o blistering sunburns.  #?Acne  ?Prior Hx?  - Pt reports that she has had significant issues with cystic acne x 1 year  - pt develops deep, painful lesions on the chin/jawline  - She noted that development of cystic lesions is cyclical, as if they are developing around her menstrual cycle, although she has been amenorrheic  - She developed scarring on the chin d/t these lesions  - Prior treatments include topical tretinoin, PO doxycycline. These did not help her acne  Prior Tx?  -?Bactrim  -?Doxycycline 100mg  daily   - Tretinoin topical cream 0.1% 2-3x weekly   - Spironolactone 150mg  daily?    Interval Hx  - taking isotretinoin x5 month  - overall it has been improving. She has seen significant improvement in the past month   - had lipids drawn and is aware of her elevated total cholesterol. Has spoken to PCP about it and has had further testing and they decided to not treat as she has no plaque build up on her arteries (states they did cardiac CT)  - overall happy with therapy. She thinks this is the best month yet  ?  Dosing:   Accutane months completed: 5 month  Weight: 81.6 kg  Cumulative dose: 8100mg  + 2400 mg = 10500 mg  Cumulative dose per kg: 128.67 mg/kg, updated 03/27/21 (goal dose: 200-220mg /kg to decrease risk of relapse)   ?  Constitutional: no fevers, chills  Skin: dry lips  MSK: no joint pains or muscle aches. Does note she is stiff in the AM but not overall bothersome to her  GI: no abdominal pain, nausea, or vomiting  Psych: no mood changes  HEENT: no headaches    .  Objective:         ? ISOtretinoin (ACCUTANE) 40 mg capsule Take one capsule by mouth twice daily.   ? mupirocin (CENTANY) 2 % topical ointment Apply  topically to affected area twice daily. Telehealth Patient Reported Vitals     Row Name 03/27/21 1422                Pain Score Zero                  Computed Telehealth Body Mass Index unavailable. Necessary lab results were not found in the last year.    Physical Exam    ?  Areas Examined (all normal unless noted below):  Head/Face  Neck  Chest/breasts/axillae  ?  Pertinent findings include:  General: Alert and Oriented x 3, Well-nourished  Eyes: Normal Conjunctivae, EOMI  Psych: normal mood  ?  A few scattered erythematous papules on cheeks, chin, face   ?     Assessment and Plan:  # Acne- hormonal,   # Xerosis secondary to isotretinoin, improving  # Severe inflammatory Acne with scarring  - Discussed treatment options including topicals, oral antibiotics, and accutane  - Discussed A/R/B's of accutane in detail  - labs reviewed: lipids and lfts reviewed from 01/27/21: is aware of her elevated total cholesterol. Has spoken to PCP about it and has had further testing and they decided to not treat as she has no plaque build up  on her arteries (states they did cardiac CT).    - Encouraged daily sunprotection and non-comedogenic moisturizer   - 2 forms of birth control: Patient is Post-menopausal   - Continue (Rx) Isotretinoin 40 mg bid  - Registered for iPledge. iPledge number:  [id number]  ?  Isotretinoin information discussed with patient:  Accutane is discussed fully with the patient. It is a very effective drug to treat acne vulgaris but has many significant side effects. Chief among these are teratogenesis, hepatic injury, dyslipidemia and severe drying of the mucous membranes, dry eyes, dry mouth, depression, unmasking crohn's/UC, SI, myalgias, bone pain, birth defects if she were to become pregnant, LFT elevation, WBC abnormalities, hypertriglyceridemia. All of these issues have been discussed in detail. Monthly blood tests to monitor lipids and liver functions will be necessary. Expect painful dryness and/or fissuring around the lips, eyes, and other moist areas of the body. Balms may be protective. Temporarily, contact lens may be too painful to wear while on this drug. Episodes of significant depression have been reported, including suicidal ideation and attempts in rare cases. It may also cause pseudotumor cerebri and hyperostosis. The patient will report any such changes in mood, depressive symptoms or suicidal thoughts, headaches, joint or bone pains.  ?  Female patients MUST use two simultaneous methods of family planning. Accutane is Category X for pregnancy, meaning it will cause fetal teratogenic malformations, and pregnancy MUST be avoided while on this drug. Patient is unable to have children  ?   Patient was reminded to discontinue medication and call immediately for signs of depression or suicidal thought, use daily photo protection and oil-free moisturizer, avoid unprotected sex, alcohol ingestion, blood donation, elective procedures (tattoos, piercings, etc.) and was warned about possibility of arrested growth. Patient was reminded to fill prescription within 7 days of visit or iPledge update.  ?  #Open sore with impetiginized crusting, resolved  - continue to monitor   ?  ?  RTC in 4 weeks       ?                       15 minutes spent on this patient's encounter with counseling and coordination of care taking >50% of the visit.

## 2021-04-14 NOTE — Progress Notes
Telehealth Visit Note    Date of Service: 04/24/2021    Subjective:        Shelley Aguilar is a 51 y.o. female here for isotretinoin follow up.     History of Present Illness  Return pt, LOV 03/27/21  ?  No personal or family h/o skin cancer. No h/o blistering sunburns.  #?Acne  ?Prior Hx?  - Pt reports that she has had significant issues with cystic acne x 1 year  - pt develops deep, painful lesions on the chin/jawline  - She noted that development of cystic lesions is cyclical, as if they are developing around her menstrual cycle, although she has been amenorrheic  - She developed scarring on the chin d/t these lesions  - Prior treatments include topical tretinoin, PO doxycycline. These did not help her acne  Prior Tx?  -?Bactrim  -?Doxycycline 100mg  daily   - Tretinoin topical cream 0.1% 2-3x weekly   - Spironolactone 150mg  daily?    Interval Hx  - taking isotretinoin x6 month  - states that she is continuing to improve  - happy with her progress  - had lipids drawn and is aware of her elevated total cholesterol. Has spoken to PCP about it and has had further testing and they decided to not treat as she has no plaque build up on her arteries (states they did cardiac CT)  - overall happy with therapy.   - she thinks this is her best month yet  ?  Dosing:   Accutane months completed: 6 month  Weight: 81.6 kg  Cumulative dose: 12900mg  + 2400 mg = 15300 mg  Cumulative dose per kg: 187mg /kg, updated 04/24/21 (goal dose: 200-220mg /kg to decrease risk of relapse)   ?  Constitutional: no fevers, chills  Skin: dry lips  MSK: no joint pains or muscle aches. Does note she is stiff in the AM but not overall bothersome to her  GI: no abdominal pain, nausea, or vomiting  Psych: no mood changes  HEENT: no headaches    .  Objective:         ? ISOtretinoin (ACCUTANE) 40 mg capsule Take one capsule by mouth twice daily.   ? mupirocin (CENTANY) 2 % topical ointment Apply  topically to affected area twice daily.           Computed Telehealth Body Mass Index unavailable. Necessary lab results were not found in the last year.    Physical Exam    ?  Areas Examined (all normal unless noted below):  Head/Face  Neck  Chest/breasts/axillae  ?  Pertinent findings include:  General: Alert and Oriented x 3, Well-nourished  Eyes: Normal Conjunctivae, EOMI  Psych: normal mood  ?  A few scattered erythematous papules on cheeks, chin, face   ?     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 Has spoken to PCP about it and has had further testing and they decided to not treat as she has no plaque build up on her arteries (states they did cardiac CT).    - Encouraged daily sunprotection and non-comedogenic moisturizer   - 2 forms of birth control: Patient is Post-menopausal   - Continue (Rx) Isotretinoin  40 mg bid  - Registered for iPledge. iPledge number:  [id number], confirmed today  ?  Isotretinoin information discussed with patient:  Accutane is discussed fully with the patient. It is a very effective drug to treat acne vulgaris but has many significant side effects. Chief among these are teratogenesis, hepatic injury, dyslipidemia and severe drying of the mucous membranes, dry eyes, dry mouth, depression, unmasking crohn's/UC, SI, myalgias, bone pain, birth defects if she were to become pregnant, LFT elevation, WBC abnormalities, hypertriglyceridemia. All of these issues have been discussed in detail. Monthly blood tests to monitor lipids and liver functions will be necessary. Expect painful dryness and/or fissuring around the lips, eyes, and other moist areas of the body. Balms may be protective. Temporarily, contact lens may be too painful to wear while on this drug. Episodes of significant depression have been reported, including suicidal ideation and attempts in rare cases. It may also cause pseudotumor cerebri and hyperostosis. The patient will report any such changes in mood, depressive symptoms or suicidal thoughts, headaches, joint or bone pains.  ?  Female patients MUST use two simultaneous methods of family planning. Accutane is Category X for pregnancy, meaning it will cause fetal teratogenic malformations, and pregnancy MUST be avoided while on this drug. Patient is unable to have children  ?   Patient was reminded to discontinue medication and call immediately for signs of depression or suicidal thought, use daily photo protection and oil-free moisturizer, avoid unprotected sex, alcohol ingestion, blood donation, elective procedures (tattoos, piercings, etc.) and was warned about possibility of arrested growth. Patient was reminded to fill prescription within 7 days of visit or iPledge update.  ?  ?  RTC in 4 weeks       ?                       15 minutes spent on this patient's encounter with counseling and coordination of care taking >50% of the visit.

## 2021-04-24 ENCOUNTER — Ambulatory Visit: Admit: 2021-04-24 | Discharge: 2021-04-25 | Payer: Private Health Insurance - Indemnity

## 2021-04-24 DIAGNOSIS — L700 Acne vulgaris: Secondary | ICD-10-CM

## 2021-04-24 DIAGNOSIS — Z5181 Encounter for therapeutic drug level monitoring: Secondary | ICD-10-CM

## 2021-04-24 MED ORDER — ISOTRETINOIN 40 MG PO CAP
40 mg | ORAL_CAPSULE | Freq: Two times a day (BID) | ORAL | 0 refills | Status: AC
Start: 2021-04-24 — End: ?

## 2021-04-24 NOTE — Progress Notes
ATTESTATION    I personally performed the key portions of the E/M visit, discussed case with resident and concur with resident documentation of history, physical exam, assessment, and treatment plan unless otherwise noted.    Staff name:  Georgiann Hahn Date:  04/24/2021

## 2021-04-28 ENCOUNTER — Encounter: Admit: 2021-04-28 | Discharge: 2021-04-28 | Payer: Private Health Insurance - Indemnity

## 2021-05-02 ENCOUNTER — Encounter: Admit: 2021-05-02 | Discharge: 2021-05-02 | Payer: Private Health Insurance - Indemnity

## 2021-05-02 MED ORDER — ISOTRETINOIN 40 MG PO CAP
40 mg | ORAL_CAPSULE | Freq: Two times a day (BID) | ORAL | 0 refills | Status: AC
Start: 2021-05-02 — End: ?

## 2021-05-02 NOTE — Telephone Encounter
[facility] LVM stating they had been unable to fulfill the isotretinoin script until today due to a delay in stock but required a 'new prescription' as the previous one had expired. Rx sent again at this time.

## 2021-05-08 ENCOUNTER — Encounter: Admit: 2021-05-08 | Discharge: 2021-05-08 | Payer: Private Health Insurance - Indemnity

## 2021-05-08 ENCOUNTER — Ambulatory Visit: Admit: 2021-05-08 | Discharge: 2021-05-09 | Payer: Private Health Insurance - Indemnity

## 2021-05-08 DIAGNOSIS — Z5181 Encounter for therapeutic drug level monitoring: Secondary | ICD-10-CM

## 2021-05-08 DIAGNOSIS — L730 Acne keloid: Secondary | ICD-10-CM

## 2021-05-08 DIAGNOSIS — L700 Acne vulgaris: Secondary | ICD-10-CM

## 2021-05-08 MED ORDER — ISOTRETINOIN 40 MG PO CAP
40 mg | ORAL_CAPSULE | Freq: Every day | ORAL | 0 refills | Status: AC
Start: 2021-05-08 — End: ?

## 2021-05-08 NOTE — Progress Notes
Telehealth Visit Note    Date of Service: 05/08/2021    Subjective:        Shelley Aguilar is a 51 y.o. female here for isotretinoin follow up.     History of Present Illness  Return pt, LOV 04/24/21  ?  No personal or family h/o skin cancer. No h/o blistering sunburns.  #?Acne  ?Prior Hx?  - Pt reports that she has had significant issues with cystic acne x 1 year  - pt develops deep, painful lesions on the chin/jawline  - She noted that development of cystic lesions is cyclical, as if they are developing around her menstrual cycle, although she has been amenorrheic  - She developed scarring on the chin d/t these lesions  - Prior treatments include topical tretinoin, PO doxycycline. These did not help her acne  Prior Tx?  -?Bactrim  -?Doxycycline 100mg  daily   - Tretinoin topical cream 0.1% 2-3x weekly   - Spironolactone 150mg  daily?    Interval Hx  - taking isotretinoin x7 months  - no new acne lesions  - overall happy with therapy.   - would like to continue one more month  ?  Dosing:   Accutane months completed: 6 month  Weight: 81.6 kg  Cumulative dose: 15300 mg + 2400 mg = 17700 mg  Cumulative dose per kg: 216.9 mg/kg, updated 05/08/21 (goal dose: 200-220mg /kg to decrease risk of relapse)   ?  Constitutional: no fevers, chills  Skin: dry lips  MSK: no joint pains or muscle aches. Does note she is stiff in the AM but not overall bothersome to her  GI: no abdominal pain, nausea, or vomiting  Psych: no mood changes  HEENT: no headaches    .  Objective:         ? ISOtretinoin (ACCUTANE) 40 mg capsule Take one capsule by mouth twice daily.   ? mupirocin (CENTANY) 2 % topical ointment Apply  topically to affected area twice daily.          Telehealth Patient Reported Vitals     Row Name 05/08/21 1251                Pain Score Zero                  Computed Telehealth Body Mass Index unavailable. Necessary lab results were not found in the last year.    Physical Exam    ?  Areas Examined (all normal unless noted below):  Head/Face  Neck  Chest/breasts/axillae  ?  Pertinent findings include:  General: Alert and Oriented x 3, Well-nourished  Eyes: Normal Conjunctivae, EOMI  Psych: normal mood  ?  1x excoriated erythematous papule on the chin  ?     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 Has spoken to PCP about it and has had further testing and they decided to not treat as she has no plaque build up on her arteries (states they did cardiac CT).    - Encouraged daily sunprotection and non-comedogenic moisturizer   - 2 forms of birth control: Patient is Post-menopausal   - continue Rx isotretinoin for 1 more month. Will plan to decrease dose from  40 mg BID to 40 mg once daily  - Registered for iPledge. iPledge number:  [id number], confirmed today  ?  Isotretinoin information discussed with patient:  Accutane is discussed fully with the patient. It is a very effective drug to treat acne vulgaris but has many significant side effects. Chief among these are teratogenesis, hepatic injury, dyslipidemia and severe drying of the mucous membranes, dry eyes, dry mouth, depression, unmasking crohn's/UC, SI, myalgias, bone pain, birth defects if she were to become pregnant, LFT elevation, WBC abnormalities, hypertriglyceridemia. All of these issues have been discussed in detail. Monthly blood tests to monitor lipids and liver functions will be necessary. Expect painful dryness and/or fissuring around the lips, eyes, and other moist areas of the body. Balms may be protective. Temporarily, contact lens may be too painful to wear while on this drug. Episodes of significant depression have been reported, including suicidal ideation and attempts in rare cases. It may also cause pseudotumor cerebri and hyperostosis. The patient will report any such changes in mood, depressive symptoms or suicidal thoughts, headaches, joint or bone pains.  ?  Female patients MUST use two simultaneous methods of family planning. Accutane is Category X for pregnancy, meaning it will cause fetal teratogenic malformations, and pregnancy MUST be avoided while on this drug. Patient is unable to have children  ?   Patient was reminded to discontinue medication and call immediately for signs of depression or suicidal thought, use daily photo protection and oil-free moisturizer, avoid unprotected sex, alcohol ingestion, blood donation, elective procedures (tattoos, piercings, etc.) and was warned about possibility of arrested growth. Patient was reminded to fill prescription within 7 days of visit or iPledge update.  ?  ?  RTC as scheduled 06/26/21.      ?                       15 minutes spent on this patient's encounter with counseling and coordination of care taking >50% of the visit.

## 2021-05-08 NOTE — Progress Notes
ATTESTATION    I personally performed the key portions of the E/M visit, discussed case with resident and concur with resident documentation of history, physical exam, assessment, and treatment plan unless otherwise noted.    Staff name:  Georgiann Hahn Date:  05/08/2021

## 2021-06-12 ENCOUNTER — Encounter: Admit: 2021-06-12 | Discharge: 2021-06-12 | Payer: Private Health Insurance - Indemnity

## 2021-06-12 MED ORDER — ZENATANE 40 MG PO CAP
40 mg | ORAL_CAPSULE | Freq: Every day | ORAL | 0 refills
Start: 2021-06-12 — End: ?

## 2021-06-25 ENCOUNTER — Encounter: Admit: 2021-06-25 | Discharge: 2021-06-25 | Payer: Private Health Insurance - Indemnity

## 2021-07-05 ENCOUNTER — Ambulatory Visit: Admit: 2021-07-05 | Discharge: 2021-07-06 | Payer: Private Health Insurance - Indemnity

## 2021-07-05 ENCOUNTER — Encounter: Admit: 2021-07-05 | Discharge: 2021-07-05 | Payer: Private Health Insurance - Indemnity

## 2021-07-05 DIAGNOSIS — L700 Acne vulgaris: Secondary | ICD-10-CM

## 2021-07-05 NOTE — Progress Notes
Telehealth Visit Note    Date of Service: 07/05/2021    Subjective:        Shelley Aguilar is a 51 y.o. female here for isotretinoin follow up.     History of Present Illness  Return pt, LOV 05/08/21  ?  No personal or family h/o skin cancer. No h/o blistering sunburns.  #?Acne  ?Prior Hx?  - Pt reports that she has had significant issues with cystic acne x 1 year  - pt develops deep, painful lesions on the chin/jawline  - She noted that development of cystic lesions is cyclical, as if they are developing around her menstrual cycle, although she has been amenorrheic  - She developed scarring on the chin d/t these lesions  - Prior treatments include topical tretinoin, PO doxycycline. These did not help her acne  Prior Tx?  -?Bactrim  -?Doxycycline 100mg  daily   - Tretinoin topical cream 0.1% 2-3x weekly   - Spironolactone 150mg  daily?    Interval Hx  - overall very happy with isotretinoin treatment  - has one bump on chin that is persistent. Can feel it more than she can see it   - overall happy with therapy.   - dryness much improved on 40mg  dose  ?  Dosing:   Accutane months completed: 7 month  Weight: 81.6 kg  Cumulative dose: 17700mg  + 1200 mg = 18900 mg  Cumulative dose per kg: 231.6 mg/kg, updated 07/05/21 (goal dose: 200-220mg /kg to decrease risk of relapse)   ?  Constitutional: no fevers, chills  Skin: dry lips  MSK: no joint pains or muscle aches. Does note she is stiff in the AM but not overall bothersome to her  GI: no abdominal pain, nausea, or vomiting  Psych: no mood changes  HEENT: no headaches    .  Objective:         ? mupirocin (CENTANY) 2 % topical ointment Apply  topically to affected area twice daily.   ? ZENATANE 40 mg capsule Take 1 capsule by mouth once daily          Telehealth Patient Reported Vitals     Row Name 07/05/21 0803                Pain Score Zero                  Computed Telehealth Body Mass Index unavailable. Necessary lab results were not found in the last year.    Physical Exam    ?  Areas Examined (all normal unless noted below):  Head/Face  Neck  Chest/breasts/axillae  ?  Pertinent findings include:  General: Alert and Oriented x 3, Well-nourished  Eyes: Normal Conjunctivae, EOMI  Psych: normal mood  ?  1x excoriated erythematous papule on the chin  ?     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  Isotretinoin information discussed with patient:  Accutane is discussed fully with the patient. It is a very effective drug to treat acne vulgaris but has many significant side effects. Chief among these are teratogenesis, hepatic injury, dyslipidemia and severe drying  of the mucous membranes, dry eyes, dry mouth, depression, unmasking crohn's/UC, SI, myalgias, bone pain, birth defects if she were to become pregnant, LFT elevation, WBC abnormalities, hypertriglyceridemia. All of these issues have been discussed in detail. Monthly blood tests to monitor lipids and liver functions will be necessary. Expect painful dryness and/or fissuring around the lips, eyes, and other moist areas of the body. Balms may be protective. Temporarily, contact lens may be too painful to wear while on this drug. Episodes of significant depression have been reported, including suicidal ideation and attempts in rare cases. It may also cause pseudotumor cerebri and hyperostosis. The patient will report any such changes in mood, depressive symptoms or suicidal thoughts, headaches, joint or bone pains.  ?  Female patients MUST use two simultaneous methods of family planning. Accutane is Category X for pregnancy, meaning it will cause fetal teratogenic malformations, and pregnancy MUST be avoided while on this drug. Patient is unable to have children  ?   Patient was reminded to discontinue medication and call immediately for signs of depression or suicidal thought, use daily photo protection and oil-free moisturizer, avoid unprotected sex, alcohol ingestion, blood donation, elective procedures (tattoos, piercings, etc.) and was warned about possibility of arrested growth. Patient was reminded to fill prescription within 7 days of visit or iPledge update.  ?  ?   RTC in 1 mo face to face to assess bump on chin and eye (chin may be true cyst)      ?                       15 minutes spent on this patient's encounter with counseling and coordination of care taking >50% of the visit.

## 2021-08-10 ENCOUNTER — Encounter: Admit: 2021-08-10 | Discharge: 2021-08-10 | Payer: Private Health Insurance - Indemnity

## 2021-08-10 ENCOUNTER — Ambulatory Visit: Admit: 2021-08-10 | Discharge: 2021-08-10 | Payer: Private Health Insurance - Indemnity

## 2021-08-10 DIAGNOSIS — L720 Epidermal cyst: Secondary | ICD-10-CM

## 2021-08-10 DIAGNOSIS — L700 Acne vulgaris: Secondary | ICD-10-CM

## 2021-08-10 DIAGNOSIS — L281 Prurigo nodularis: Secondary | ICD-10-CM

## 2021-08-10 DIAGNOSIS — L680 Hirsutism: Secondary | ICD-10-CM

## 2021-08-10 MED ORDER — TRIAMCINOLONE ACETONIDE 10 MG/ML IJ SUSP
5 mg | Freq: Once | INTRALESIONAL | 0 refills | Status: CP
Start: 2021-08-10 — End: ?
  Administered 2021-08-10: 14:00:00 5 mg via INTRALESIONAL

## 2021-08-10 NOTE — Progress Notes
Telehealth Visit Note    Date of Service: 08/10/2021    Subjective:        Shelley Aguilar is a 51 y.o. female here for isotretinoin follow up.     History of Present Illness  Return pt, LOV 07/05/21  ?  No personal or family h/o skin cancer. No h/o blistering sunburns.  #?Acne  ?Prior Hx?  - Pt reports that she has had significant issues with cystic acne x 1 year  - pt develops deep, painful lesions on the chin/jawline  - She noted that development of cystic lesions is cyclical, as if they are developing around her menstrual cycle, although she has been amenorrheic  - She developed scarring on the chin d/t these lesions  - Prior treatments include topical tretinoin, PO doxycycline. These did not help her acne  Prior Tx?  -?Bactrim  -?Doxycycline 100mg  daily   - Tretinoin topical cream 0.1% 2-3x weekly   - Spironolactone 150mg  daily?    Interval Hx  - stopped isotretinoin x7 months at last vist   - has developed a few new new acne lesions on her chin, forehead, R eyebrow, and R lower eyelid    - lesion on on chin is firm and sometimes tender to touch   - has not started any topicals   - also concerned about new dark chin hairs that have started to grow since starting isotretinoin   ?  Dosing:   Accutane months completed: 7 month  Weight: 81.6 kg  Cumulative dose: 18900 + 1200 = 20,1000   Cumulative dose per kg: 261 mg/kg, updated 08/10/21   ?  Constitutional: no fevers, chills  Skin: dry lips  MSK: no joint pains or muscle aches. Does note she is stiff in the AM but not overall bothersome to her  GI: no abdominal pain, nausea, or vomiting  Psych: no mood changes  HEENT: no headaches    .  Objective:         ? mupirocin (CENTANY) 2 % topical ointment Apply  topically to affected area twice daily.   ? ZENATANE 40 mg capsule Take 1 capsule by mouth once daily          Telehealth Patient Reported Vitals     Row Name 08/10/21 0759                Pain Score Zero                  Computed Telehealth Body Mass Index unavailable. Necessary lab results were not found in the last year.    Physical Exam    ?  Areas Examined (all normal unless noted below):  Head/Face  Neck  Chest/breasts/axillae  ?  Pertinent findings include:  General: Alert and Oriented x 3, Well-nourished  Eyes: Normal Conjunctivae, EOMI  Psych: normal mood  ?  1x excoriated, firm erythematous papule is noted on the L chin with overlying crust.   Erythematous excoriated papules are noted on the R eyebrow and R forehead.   1 mm yellow subepidermal, fixed papule is noted on the R lower eyelid.   No chin hairs present on exam today .  ?     Assessment and Plan:    # Severe inflammatory Acne with scarring - hormonal, chronic, s/p isotretinoin completed 07/2021, cumulative dose 261 mg/kg, flaring today   - Reviewed diagnosis and importance of maintaining with topicals after completion of isotretinoin   - Start Tretinoin 0.1% topical cream, apply pea-sized  amount to entire face at night, start using 1-2x/week with goal to increase to nightly as tolerated; advised of side effects of dryness/redness/flaking/irritation, recommended use under moisturizer like CeraVe or Cetaphil cream to help with these symptoms. Patient has tube at home from outside dermatologist.   - Advised that patient should avoid picking and scratching at acne lesions   - Prior iPledge number:  [id number], confirmed today    #Prurigo nodule, L chin  - Reviewed diagnosis and recommended treatment option including watchful waiting, ILK, and referral to plastics for surgical removal  - Patient elects for ILK  - A/R/Bs of ILK reviewed in details with patient   - Wound care with frequent application of Vaseline discussed   ?  #Milia, R lower eyelid   - Reviewed diagnosis   - Reassurance     #Hirsutism, Chin  - Patient reports increased dark hairs growing from chin - none on exam today as patient has been plucking  - Reviewed diagnosis  - Advised laser therapy in the future, at least 6 months following completion of isotretinoin therapy     Irma Newness, MD  Dermatology      Intralesional Triamcinolone Procedure Note    Risk and benefits of the above procedure including bleeding, pain, dyspigmentation, scar, depression in skin, infection, recurrence or nerve damage with loss of muscle function and/or skin sensation were discussed with the patient (or legal guardian) in detail, who afterwards decided to proceed with the procedure.    Diagnosis: see progress note  Body Site: see progress note  Concentration: Kenalog 10   Volume injected: 0.07 cc  Kenalog    Lot number: see MAR   Expiration Date: see MAR  Diluent: Bacteriostatic Saline   Lot Number: see MAR   Expiration Date: see MAR  Hemostasis:  Pressure  Wound dressing: Bandaid  Wound care instructions given:  Verbal  Complications:  None  Tolerated well:  Yes  Ambulated from room:  Yes  Duration of procedure:  >5 minutes    ?  RTC 3 months or sooner PRN     Irma Newness, MD  Dermatology Resident  ?

## 2021-08-10 NOTE — Progress Notes
ATTESTATION    I personally performed the key portions of the E/M visit, discussed case with resident and concur with resident documentation of history, physical exam, assessment, and treatment plan unless otherwise noted.    I performed the key components of the intralesional steroid injection including site identification, discussion with patient, injection type and choice, and was present during the procedure.       Staff name:  Georgiann Hahn Date:  08/10/2021

## 2023-06-17 IMAGING — [facility] MM mammogram 3D screen bilat
8 series · 9 of 24 positions shown · non-contrast
Comparison: none

[R CC]
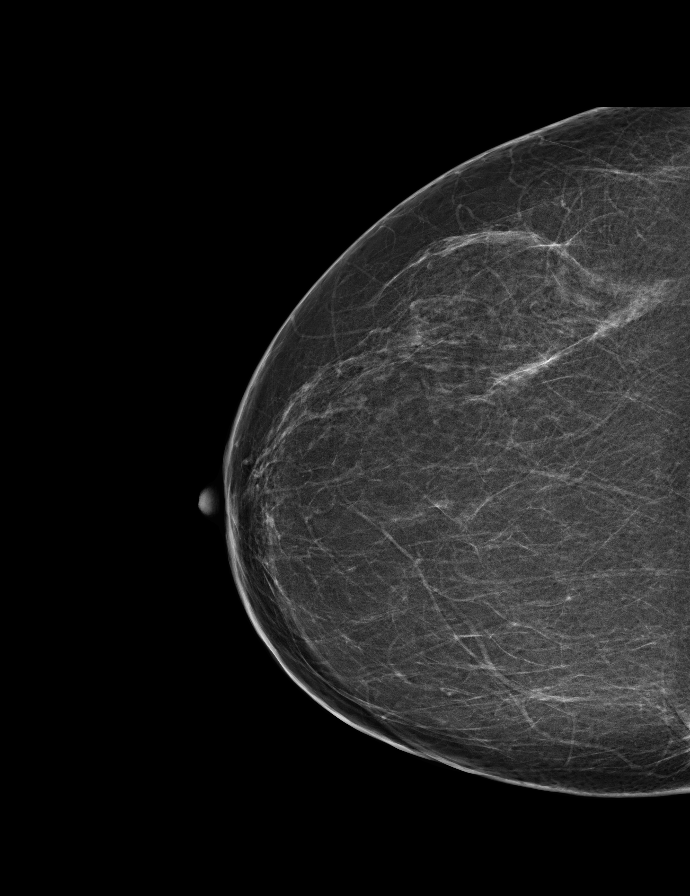

[Series 4: (PERSON_NAME)_TOMO R-CC PRIME, EMPIRE_C. tomo · 2 of 70 frames shown]
[frame 23/70]
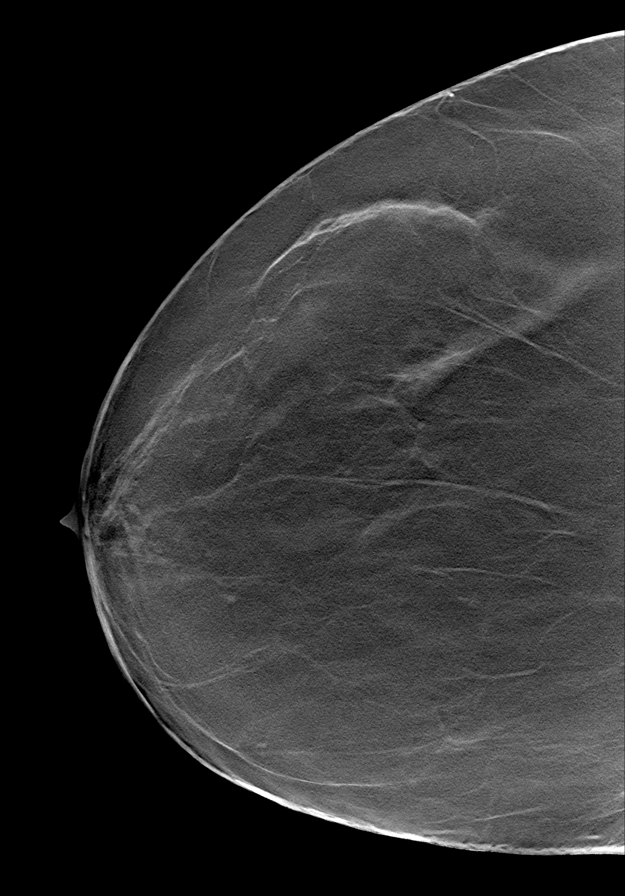
[frame 35/70]
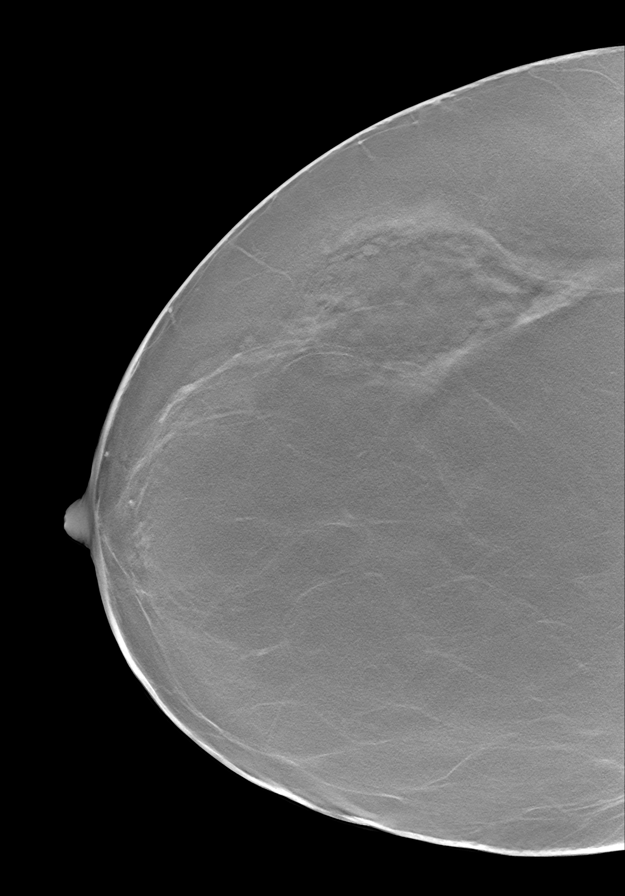

[R MLO]
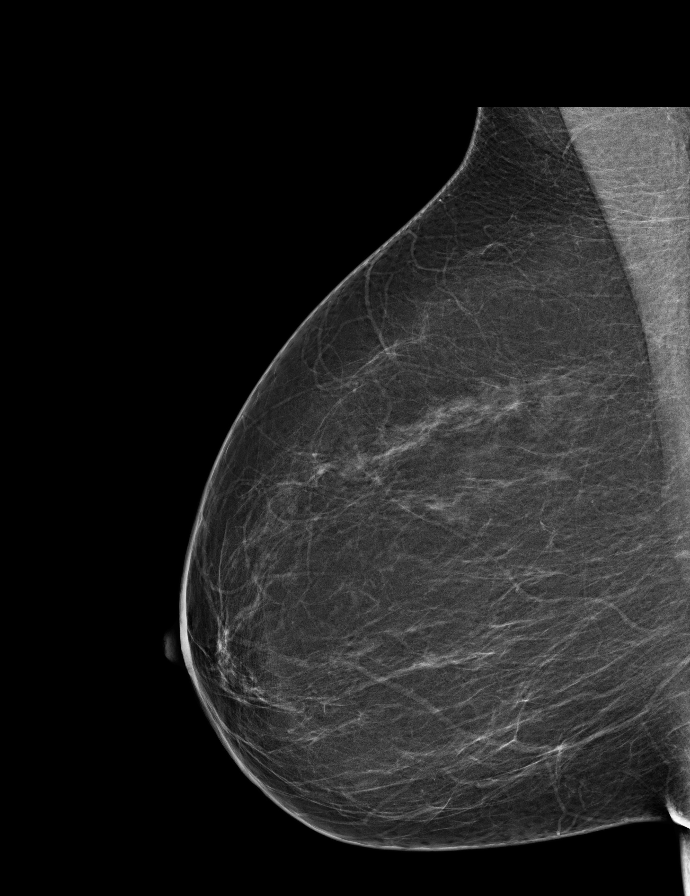

[[PERSON_NAME]_TOMO R-MLO PRIME, EMPIRE_C tomo · tomo slice 37/73.0]
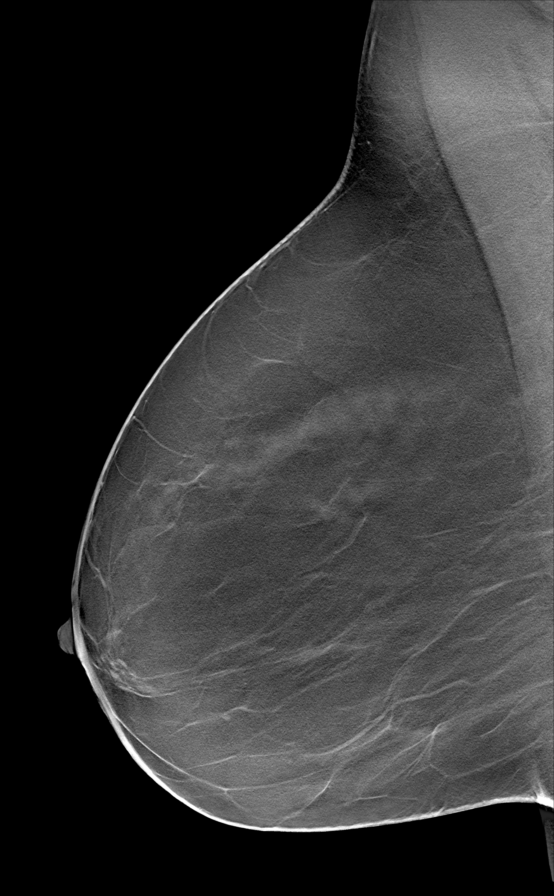

[L MLO]
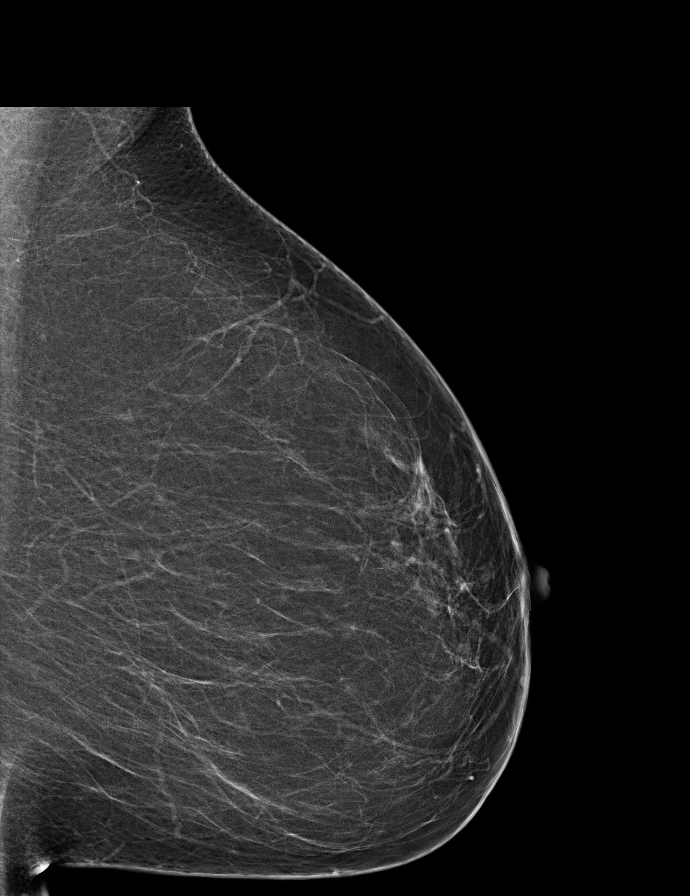

[[PERSON_NAME]_TOMO L-MLO PRIME, EMPIRE_C tomo · tomo slice 37/73.0]
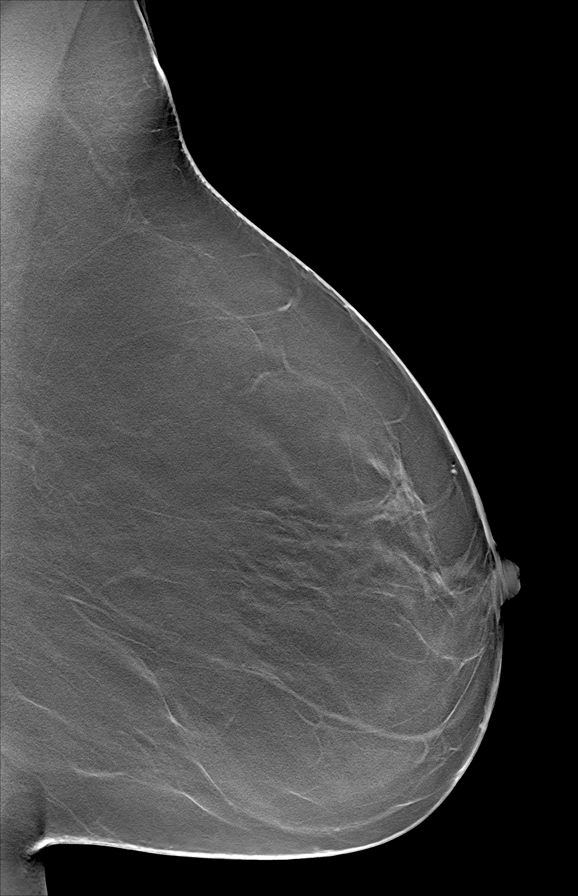

[L CC]
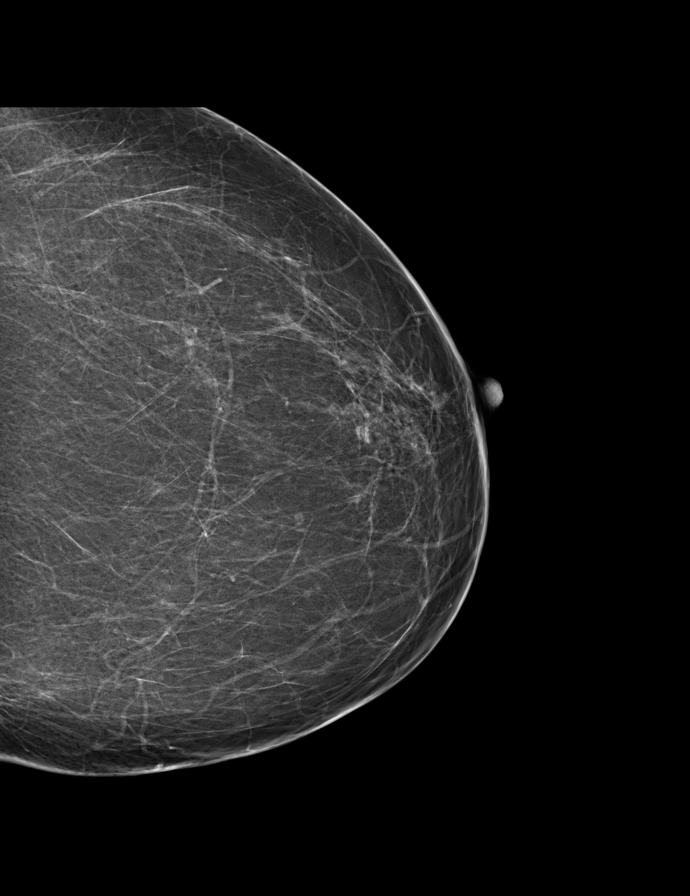

[[PERSON_NAME]_TOMO L-CC PRIME, EMPIRE_C. tomo · tomo slice 36/71.0]
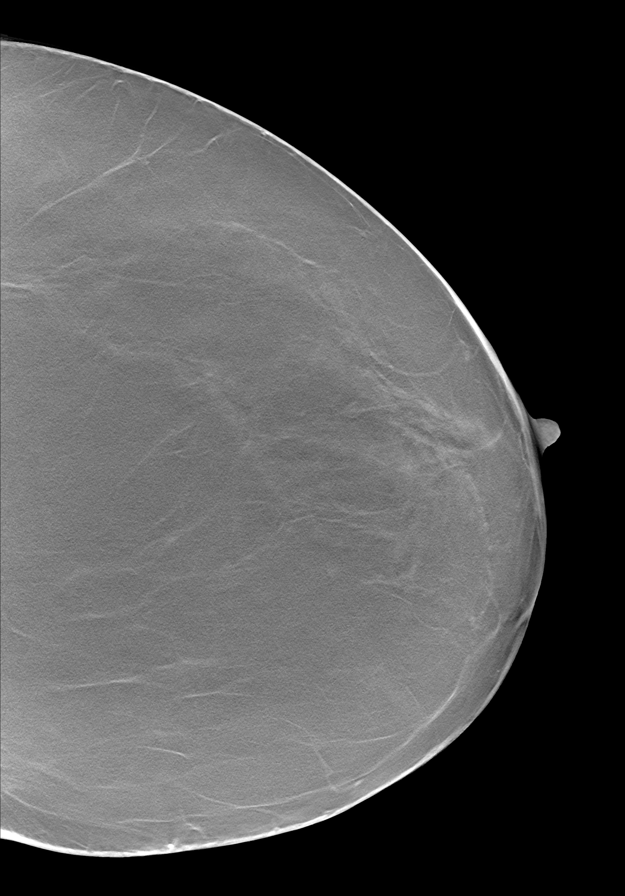

[9 of 24 positions shown; findings below may reference images not displayed]

EXAM

MM mammogram 3D screen bilat

INDICATION

screening
SCREENING. KF 3D. PRIORS 5155.  LIFETIME TC SCORE: 10.3%

TECHNIQUE

2D and tomosynthesis digital craniocaudal and mediolateral oblique views were obtained of both
breasts.

COMPARISONS

03/10/20

FINDINGS

Scattered fibroglandular tissue density.

No suspicious microcalcification, architectural distortion, or spiculated mass.

IMPRESSION
1. BI-RADS 1 - Negative.

Tech Notes:
